# Patient Record
Sex: Female | Born: 1965
Health system: Southern US, Community
[De-identification: ages and names within clinical notes are randomized; demographics above are authoritative.]

## PROBLEM LIST (undated history)

## (undated) DIAGNOSIS — Z789 Other specified health status: Secondary | ICD-10-CM

## (undated) HISTORY — PX: TUBAL LIGATION: SHX77

---

## 1997-10-04 ENCOUNTER — Encounter: Admission: RE | Admit: 1997-10-04 | Discharge: 1998-01-02 | Payer: Self-pay | Admitting: Internal Medicine

## 2003-07-03 ENCOUNTER — Ambulatory Visit (HOSPITAL_COMMUNITY): Admission: RE | Admit: 2003-07-03 | Discharge: 2003-07-03 | Payer: Self-pay | Admitting: Obstetrics

## 2004-09-04 ENCOUNTER — Ambulatory Visit (HOSPITAL_COMMUNITY): Admission: RE | Admit: 2004-09-04 | Discharge: 2004-09-04 | Payer: Self-pay | Admitting: Obstetrics

## 2005-10-14 ENCOUNTER — Ambulatory Visit (HOSPITAL_COMMUNITY): Admission: RE | Admit: 2005-10-14 | Discharge: 2005-10-14 | Payer: Self-pay | Admitting: Obstetrics

## 2006-10-18 ENCOUNTER — Ambulatory Visit (HOSPITAL_COMMUNITY): Admission: RE | Admit: 2006-10-18 | Discharge: 2006-10-18 | Payer: Self-pay | Admitting: Obstetrics

## 2007-01-06 ENCOUNTER — Ambulatory Visit (HOSPITAL_COMMUNITY): Admission: RE | Admit: 2007-01-06 | Discharge: 2007-01-06 | Payer: Self-pay | Admitting: Obstetrics

## 2007-01-31 ENCOUNTER — Ambulatory Visit (HOSPITAL_COMMUNITY): Admission: RE | Admit: 2007-01-31 | Discharge: 2007-01-31 | Payer: Self-pay | Admitting: Obstetrics

## 2007-07-29 ENCOUNTER — Emergency Department (HOSPITAL_COMMUNITY): Admission: EM | Admit: 2007-07-29 | Discharge: 2007-07-30 | Payer: Self-pay | Admitting: Emergency Medicine

## 2007-10-20 ENCOUNTER — Ambulatory Visit (HOSPITAL_COMMUNITY): Admission: RE | Admit: 2007-10-20 | Discharge: 2007-10-20 | Payer: Self-pay | Admitting: Obstetrics

## 2008-09-21 ENCOUNTER — Encounter: Payer: Self-pay | Admitting: Obstetrics

## 2008-09-21 ENCOUNTER — Ambulatory Visit (HOSPITAL_COMMUNITY): Admission: RE | Admit: 2008-09-21 | Discharge: 2008-09-21 | Payer: Self-pay | Admitting: Obstetrics

## 2008-10-23 ENCOUNTER — Ambulatory Visit (HOSPITAL_COMMUNITY): Admission: RE | Admit: 2008-10-23 | Discharge: 2008-10-23 | Payer: Self-pay | Admitting: Obstetrics

## 2009-10-17 ENCOUNTER — Encounter: Admission: RE | Admit: 2009-10-17 | Discharge: 2009-10-17 | Payer: Self-pay | Admitting: Internal Medicine

## 2009-10-30 ENCOUNTER — Ambulatory Visit (HOSPITAL_COMMUNITY): Admission: RE | Admit: 2009-10-30 | Discharge: 2009-10-30 | Payer: Self-pay | Admitting: Obstetrics

## 2010-10-09 ENCOUNTER — Other Ambulatory Visit: Payer: Self-pay | Admitting: Obstetrics

## 2010-10-09 DIAGNOSIS — Z1231 Encounter for screening mammogram for malignant neoplasm of breast: Secondary | ICD-10-CM

## 2010-10-09 LAB — CBC
HCT: 37.2 % (ref 36.0–46.0)
Hemoglobin: 12.4 g/dL (ref 12.0–15.0)
MCHC: 33.2 g/dL (ref 30.0–36.0)
MCV: 85.9 fL (ref 78.0–100.0)
Platelets: 192 10*3/uL (ref 150–400)
RBC: 4.33 MIL/uL (ref 3.87–5.11)
RDW: 14.3 % (ref 11.5–15.5)
WBC: 4.1 10*3/uL (ref 4.0–10.5)

## 2010-10-09 LAB — PREGNANCY, URINE: Preg Test, Ur: NEGATIVE

## 2010-11-03 ENCOUNTER — Ambulatory Visit (HOSPITAL_COMMUNITY)
Admission: RE | Admit: 2010-11-03 | Discharge: 2010-11-03 | Disposition: A | Discharge status: Home / Self Care | Payer: 59 | Source: Ambulatory Visit | Attending: Obstetrics | Admitting: Obstetrics

## 2010-11-03 DIAGNOSIS — Z1231 Encounter for screening mammogram for malignant neoplasm of breast: Secondary | ICD-10-CM | POA: Insufficient documentation

## 2010-11-11 NOTE — Op Note (Signed)
Nicole [name], CIRILO NO.:  [id number]   MEDICAL RECORD NO.:  [id number]          PATIENT TYPE:  AMB   LOCATION:  SDC                           FACILITY:  [facility]   PHYSICIAN:  Charles A. Clearance Coots, M.D.DATE OF BIRTH:  [birth year]   DATE OF PROCEDURE:  09/21/2008  DATE OF DISCHARGE:                               OPERATIVE REPORT   PREOPERATIVE DIAGNOSIS:  Menorrhagia.   POSTOPERATIVE DIAGNOSIS:  Menorrhagia.   PROCEDURES:  Hysteroscopy, dilation and curettage, and bipolar  endometrial ablation.   SURGEON:  Charles A. Clearance Coots, MD   ANESTHESIA:  General.   ESTIMATED BLOOD LOSS:  25 mL.   COMPLICATIONS:  None.   FINDINGS:  Endometrial polyps.   SPECIMEN:  Endometrial curettings.   DISPOSITION:  Specimen to Pathology.   OPERATION:  The patient was brought to the operating room.  After  satisfactory endotracheal general anesthesia, the vagina was prepped and  draped in the usual sterile fashion.  Sterile speculum was inserted in  the vaginal vault and the cervix was isolated.  The cervical canal was  sounded to 4 cm from the external os to the internal os.  The uterus was  then sounded to 9 cm.  The hysteroscope was then introduced into the  uterine cavity and hysteroscopic survey was done, 1-2 cm endometrial  polyps were observed along the anterior and posterior endometrium.  The  cervix was then dilated to 25 mm.  A  medium sharp curette was then  introduced into the uterine cavity and the endometrial cavity was  thoroughly curetted and the specimen was submitted to Pathology for  evaluation.  The NovaSure bipolar endometrial ablation instrument was  then introduced into the uterine cavity and the bipolar endometrial  ablation was performed in routine fashion at a power of 129 watts for 57  seconds.  The NovaSure instrument was then removed and the hysteroscope  was reintroduced into the uterine cavity and hysteroscopic survey  revealed a very adequate ablation  to the most posterior aspect of the  uterus and in each cornu.  There were no  polyps observed and the endometrial cavity appeared free of polyps.  The  hysteroscope was then removed.  There was no active bleeding at the  conclusion of the procedure.  All instruments were retired.  The patient  tolerated the procedure well, transported to the recovery room in  satisfactory condition.       Charles A. Clearance Coots, M.D.  Electronically Signed     CAH/MEDQ  D:  09/21/2008  T:  09/22/2008  Job:  [id number]

## 2011-03-19 LAB — D-DIMER, QUANTITATIVE: D-Dimer, Quant: 0.5 — ABNORMAL HIGH

## 2011-03-19 LAB — POCT CARDIAC MARKERS
CKMB, poc: <1 — ABNORMAL LOW
CKMB, poc: <1 — ABNORMAL LOW
Myoglobin, poc: 40.8
Myoglobin, poc: 46.8
Operator id: 294511
Operator id: 294511
Troponin i, poc: <0.05
Troponin i, poc: <0.05

## 2011-03-19 LAB — DIFFERENTIAL
Basophils Absolute: 0
Basophils Relative: 1
Eosinophils Absolute: 0.1
Eosinophils Relative: 1
Lymphocytes Relative: 29
Lymphs Abs: 1.7
Monocytes Absolute: 0.5
Monocytes Relative: 8
Neutro Abs: 3.7
Neutrophils Relative %: 62

## 2011-03-19 LAB — CBC
HCT: 35.4 — ABNORMAL LOW
Hemoglobin: 11.9 — ABNORMAL LOW
MCHC: 33.5
MCV: 85
Platelets: 201
RBC: 4.17
RDW: 14.6
WBC: 6

## 2011-03-19 LAB — I-STAT 8, (EC8 V) (CONVERTED LAB)
Acid-base deficit: 3 — ABNORMAL HIGH
BUN: 10
Bicarbonate: 22.9
Chloride: 107
Glucose, Bld: 118 — ABNORMAL HIGH
HCT: 39
Hemoglobin: 13.3
Operator id: 161631
Potassium: 3.5
Sodium: 141
TCO2: 24
pCO2, Ven: 42.3 — ABNORMAL LOW
pH, Ven: 7.343 — ABNORMAL HIGH

## 2011-03-19 LAB — PROTIME-INR
INR: 1
Prothrombin Time: 13.2

## 2011-03-19 LAB — POCT I-STAT CREATININE
Creatinine, Ser: 0.9
Operator id: 161631

## 2011-10-12 ENCOUNTER — Other Ambulatory Visit: Payer: Self-pay | Admitting: Obstetrics

## 2011-10-12 DIAGNOSIS — Z1231 Encounter for screening mammogram for malignant neoplasm of breast: Secondary | ICD-10-CM

## 2011-11-05 ENCOUNTER — Ambulatory Visit (HOSPITAL_COMMUNITY)
Admission: RE | Admit: 2011-11-05 | Discharge: 2011-11-05 | Disposition: A | Discharge status: Home / Self Care | Payer: 59 | Source: Ambulatory Visit | Attending: Obstetrics | Admitting: Obstetrics

## 2011-11-05 DIAGNOSIS — Z1231 Encounter for screening mammogram for malignant neoplasm of breast: Secondary | ICD-10-CM | POA: Insufficient documentation

## 2012-10-06 ENCOUNTER — Other Ambulatory Visit: Payer: Self-pay | Admitting: Obstetrics

## 2012-10-06 DIAGNOSIS — Z1231 Encounter for screening mammogram for malignant neoplasm of breast: Secondary | ICD-10-CM

## 2012-11-07 ENCOUNTER — Ambulatory Visit (HOSPITAL_COMMUNITY)
Admission: RE | Admit: 2012-11-07 | Discharge: 2012-11-07 | Disposition: A | Discharge status: Home / Self Care | Payer: 59 | Source: Ambulatory Visit | Attending: Obstetrics | Admitting: Obstetrics

## 2012-11-07 DIAGNOSIS — Z1231 Encounter for screening mammogram for malignant neoplasm of breast: Secondary | ICD-10-CM | POA: Insufficient documentation

## 2013-10-03 ENCOUNTER — Other Ambulatory Visit: Payer: Self-pay | Admitting: Obstetrics

## 2013-10-03 DIAGNOSIS — Z1231 Encounter for screening mammogram for malignant neoplasm of breast: Secondary | ICD-10-CM

## 2013-11-09 ENCOUNTER — Ambulatory Visit (HOSPITAL_COMMUNITY)
Admission: RE | Admit: 2013-11-09 | Discharge: 2013-11-09 | Disposition: A | Discharge status: Home / Self Care | Payer: 59 | Source: Ambulatory Visit | Attending: Obstetrics | Admitting: Obstetrics

## 2013-11-09 DIAGNOSIS — Z1231 Encounter for screening mammogram for malignant neoplasm of breast: Secondary | ICD-10-CM | POA: Insufficient documentation

## 2014-03-06 ENCOUNTER — Ambulatory Visit: Payer: 59 | Admitting: Obstetrics

## 2014-04-04 ENCOUNTER — Ambulatory Visit (INDEPENDENT_AMBULATORY_CARE_PROVIDER_SITE_OTHER): Payer: 59 | Admitting: Obstetrics

## 2014-04-04 ENCOUNTER — Encounter: Payer: Self-pay | Admitting: Obstetrics

## 2014-04-04 VITALS — BP 130/88 | HR 81 | Temp 97.4°F | Ht 68.5 in | Wt 208.0 lb

## 2014-04-04 DIAGNOSIS — Z0000 Encounter for general adult medical examination without abnormal findings: Secondary | ICD-10-CM

## 2014-04-04 DIAGNOSIS — Z01419 Encounter for gynecological examination (general) (routine) without abnormal findings: Secondary | ICD-10-CM

## 2014-04-04 NOTE — Progress Notes (Signed)
Subjective:     Karna DupesLaquita M Souffrant is a 48 y.o. female here for a routine exam.  Current complaints: none.    Personal health questionnaire:  Is patient Ashkenazi Jewish, have a family history of breast and/or ovarian cancer: no Is there a family history of uterine cancer diagnosed at age < 5250, gastrointestinal cancer, urinary tract cancer, family member who is a Personnel officerLynch syndrome-associated carrier: no Is the patient overweight and hypertensive, family history of diabetes, personal history of gestational diabetes or PCOS: no Is patient over 3655, have PCOS,  family history of premature CHD under age 50, diabetes, smoke, have hypertension or peripheral artery disease:  no At any time, has a partner hit, kicked or otherwise hurt or frightened you?: no Over the past 2 weeks, have you felt down, depressed or hopeless?: no Over the past 2 weeks, have you felt little interest or pleasure in doing things?:no   Gynecologic History No LMP recorded. Contraception: tubal ligation Last Pap: 2014. Results were: normal Last mammogram: 2015. Results were: normal  Obstetric History OB History  No data available    History reviewed. No pertinent past medical history.  History reviewed. No pertinent past surgical history.  No current outpatient prescriptions on file. Not on File  History  Substance Use Topics  . Smoking status: Not on file  . Smokeless tobacco: Not on file  . Alcohol Use: Not on file    History reviewed. No pertinent family history.    Review of Systems  Constitutional: negative for fatigue and weight loss Respiratory: negative for cough and wheezing Cardiovascular: negative for chest pain, fatigue and palpitations Gastrointestinal: negative for abdominal pain and change in bowel habits Musculoskeletal:negative for myalgias Neurological: negative for gait problems and tremors Behavioral/Psych: negative for abusive relationship, depression Endocrine: negative for temperature  intolerance   Genitourinary:negative for abnormal menstrual periods, genital lesions, hot flashes, sexual problems and vaginal discharge Integument/breast: negative for breast lump, breast tenderness, nipple discharge and skin lesion(s)    Objective:       There were no vitals taken for this visit. General:   alert  Skin:   no rash or abnormalities  Lungs:   clear to auscultation bilaterally  Heart:   regular rate and rhythm, S1, S2 normal, no murmur, click, rub or gallop  Breasts:   normal without suspicious masses, skin or nipple changes or axillary nodes  Abdomen:  normal findings: no organomegaly, soft, non-tender and no hernia  Pelvis:  External genitalia: normal general appearance Urinary system: urethral meatus normal and bladder without fullness, nontender Vaginal: normal without tenderness, induration or masses Cervix: normal appearance Adnexa: normal bimanual exam Uterus: anteverted and non-tender, normal size   Lab Review Urine pregnancy test Labs reviewed yes Radiologic studies reviewed yes    Assessment:    Healthy female exam.    Plan:    Education reviewed: calcium supplements, low fat, low cholesterol diet, self breast exams and weight bearing exercise. Follow up in: 1 year.   No orders of the defined types were placed in this encounter.   No orders of the defined types were placed in this encounter.

## 2014-04-04 NOTE — Addendum Note (Signed)
Addended by: Coral CeoHARPER, Hatley Henegar A on: 04/04/2014 05:13 PM   Modules accepted: Orders

## 2014-04-05 ENCOUNTER — Other Ambulatory Visit: Payer: Self-pay | Admitting: Obstetrics

## 2014-04-05 DIAGNOSIS — B9689 Other specified bacterial agents as the cause of diseases classified elsewhere: Secondary | ICD-10-CM

## 2014-04-05 DIAGNOSIS — N760 Acute vaginitis: Secondary | ICD-10-CM

## 2014-04-05 LAB — WET PREP BY MOLECULAR PROBE
Candida species: NEGATIVE
GARDNERELLA VAGINALIS: POSITIVE — AB
Trichomonas vaginosis: NEGATIVE

## 2014-04-05 MED ORDER — METRONIDAZOLE 500 MG PO TABS: 500.0000 mg | ORAL_TABLET | Freq: Two times a day (BID) | ORAL | Status: DC

## 2014-04-06 LAB — PAP IG AND HPV HIGH-RISK: HPV DNA HIGH RISK: NOT DETECTED

## 2014-04-30 ENCOUNTER — Encounter: Payer: Self-pay | Admitting: Obstetrics

## 2014-07-25 ENCOUNTER — Other Ambulatory Visit: Payer: Self-pay | Admitting: Gastroenterology

## 2014-09-26 ENCOUNTER — Encounter (HOSPITAL_COMMUNITY): Payer: Self-pay | Admitting: *Deleted

## 2014-10-08 ENCOUNTER — Encounter (HOSPITAL_COMMUNITY): Payer: Self-pay | Admitting: *Deleted

## 2014-10-08 ENCOUNTER — Ambulatory Visit (HOSPITAL_COMMUNITY): Payer: 59 | Admitting: Anesthesiology

## 2014-10-08 ENCOUNTER — Encounter (HOSPITAL_COMMUNITY)
Admission: RE | Disposition: A | Discharge status: Home / Self Care | Payer: Self-pay | Source: Ambulatory Visit | Attending: Gastroenterology

## 2014-10-08 ENCOUNTER — Ambulatory Visit (HOSPITAL_COMMUNITY)
Admission: RE | Admit: 2014-10-08 | Discharge: 2014-10-08 | Disposition: A | Discharge status: Home / Self Care | Payer: 59 | Source: Ambulatory Visit | Attending: Gastroenterology | Admitting: Gastroenterology

## 2014-10-08 DIAGNOSIS — Z6829 Body mass index (BMI) 29.0-29.9, adult: Secondary | ICD-10-CM | POA: Insufficient documentation

## 2014-10-08 DIAGNOSIS — Z1211 Encounter for screening for malignant neoplasm of colon: Secondary | ICD-10-CM | POA: Insufficient documentation

## 2014-10-08 DIAGNOSIS — Z9851 Tubal ligation status: Secondary | ICD-10-CM | POA: Insufficient documentation

## 2014-10-08 DIAGNOSIS — E669 Obesity, unspecified: Secondary | ICD-10-CM | POA: Insufficient documentation

## 2014-10-08 HISTORY — DX: Other specified health status: Z78.9

## 2014-10-08 HISTORY — PX: COLONOSCOPY WITH PROPOFOL: SHX5780

## 2014-10-08 SURGERY — COLONOSCOPY WITH PROPOFOL
Anesthesia: Monitor Anesthesia Care

## 2014-10-08 MED ORDER — PROPOFOL 10 MG/ML IV BOLUS
INTRAVENOUS | Status: DC | PRN
  Administered 2014-10-08: 100 mg via INTRAVENOUS
  Administered 2014-10-08 (×2): 10 mg via INTRAVENOUS
  Administered 2014-10-08: 100 mg via INTRAVENOUS

## 2014-10-08 MED ORDER — LIDOCAINE HCL (PF) 2 % IJ SOLN
INTRAMUSCULAR | Status: DC | PRN
  Administered 2014-10-08: 20 mg via INTRADERMAL

## 2014-10-08 MED ORDER — PROPOFOL 10 MG/ML IV BOLUS
INTRAVENOUS | Status: AC
  Filled 2014-10-08: qty 20

## 2014-10-08 MED ORDER — LIDOCAINE HCL (CARDIAC) 20 MG/ML IV SOLN
INTRAVENOUS | Status: AC
  Filled 2014-10-08: qty 5

## 2014-10-08 MED ORDER — SODIUM CHLORIDE 0.9 % IV SOLN: INTRAVENOUS | Status: DC

## 2014-10-08 MED ORDER — LACTATED RINGERS IV SOLN
INTRAVENOUS | Status: DC
  Administered 2014-10-08: 1000 mL via INTRAVENOUS

## 2014-10-08 SURGICAL SUPPLY — 22 items

## 2014-10-08 NOTE — Anesthesia Postprocedure Evaluation (Signed)
Anesthesia Post Note  Patient: Nicole [name] Man  Procedure(s) Performed: Procedure(s) (LRB): COLONOSCOPY WITH PROPOFOL (N/A)  Anesthesia type: MAC  Patient location: PACU  Post pain: Pain level controlled  Post assessment: Post-op Vital signs reviewed  Last Vitals: BP 164/72 mmHg  Pulse 52  Temp(Src) 36.6 C (Oral)  Resp 20  Ht 5' 8.5" (1.74 [name])  Wt 198 lb (89.812 kg)  BMI 29.66 kg/m2  SpO2 100%  LMP 10/08/2014 (Exact Date)  Post vital signs: Reviewed  Level of consciousness: awake  Complications: No apparent anesthesia complications

## 2014-10-08 NOTE — Transfer of Care (Signed)
Immediate Anesthesia Transfer of Care Note  Patient: Nicole [name]  Procedure(s) Performed: Procedure(s) (LRB): COLONOSCOPY WITH PROPOFOL (N/A)  Patient Location: PACU  Anesthesia Type: MAC  Level of Consciousness: sedated, patient cooperative and responds to stimulation  Airway & Oxygen Therapy: Patient Spontanous Breathing and Patient connected to face mask oxgen  Post-op Assessment: Report given to PACU RN and Post -op Vital signs reviewed and stable  Post vital signs: Reviewed and stable  Complications: No apparent anesthesia complications

## 2014-10-08 NOTE — Anesthesia Preprocedure Evaluation (Signed)
Anesthesia Evaluation  Patient identified by MRN, date of birth, ID band Patient awake    Reviewed: Allergy & Precautions, NPO status , Patient's Chart, lab work & pertinent test results  Airway Mallampati: II  TM Distance: >3 FB Neck ROM: Full    Dental no notable dental hx.    Pulmonary neg pulmonary ROS,  breath sounds clear to auscultation  Pulmonary exam normal       Cardiovascular negative cardio ROS  Rhythm:Regular Rate:Normal     Neuro/Psych negative neurological ROS  negative psych ROS   GI/Hepatic negative GI ROS, Neg liver ROS,   Endo/Other  negative endocrine ROS  Renal/GU negative Renal ROS     Musculoskeletal negative musculoskeletal ROS (+)   Abdominal (+) + obese,   Peds  Hematology negative hematology ROS (+)   Anesthesia Other Findings   Reproductive/Obstetrics negative OB ROS                             Anesthesia Physical Anesthesia Plan  ASA: II  Anesthesia Plan: MAC   Post-op Pain Management:    Induction: Intravenous  Airway Management Planned:   Additional Equipment:   Intra-op Plan:   Post-operative Plan:   Informed Consent: I have reviewed the patients History and Physical, chart, labs and discussed the procedure including the risks, benefits and alternatives for the proposed anesthesia with the patient or authorized representative who has indicated his/her understanding and acceptance.   Dental advisory given  Plan Discussed with: CRNA  Anesthesia Plan Comments:         Anesthesia Quick Evaluation

## 2014-10-08 NOTE — Discharge Instructions (Signed)

## 2014-10-08 NOTE — Op Note (Signed)
Procedure: Baseline screening colonoscopy  Endoscopist: Danise EdgeMartin Johnson  Premedication: Propofol administered by anesthesia  Procedure: The patient was placed in the left lateral decubitus position. Anal inspection and digital rectal exam were normal. The Pentax pediatric colonoscope was introduced into the rectum and advanced to the cecum. A normal-appearing ileocecal valve and appendiceal orifice were identified. Colonic preparation for the exam today was good. Withdrawal time was 10 minutes  Rectum. Normal. Retroflexed view of the distal rectum normal  Sigmoid colon and descending colon. Normal  Splenic flexure. Normal  Transverse colon. Normal  Hepatic flexure. Normal  Ascending colon. Normal  Cecum and ileocecal valve. Normal  Assessment: Normal baseline screening colonoscopy  Recommendation: Schedule repeat screening colonoscopy in 10 years

## 2014-10-08 NOTE — H&P (Signed)
  Procedure: Baseline screening colonoscopy.  History: The patient is a [age] year old female born in 1966-03-27. She is scheduled to undergo her first screening colonoscopy.  Past medical history: Bilateral tubal ligation  Family history: No family history of colon cancer  Exam: The patient is alert and lying comfortably on the endoscopy stretcher. Abdomen is soft and nontender to palpation. Lungs are clear to auscultation. Cardiac exam reveals a regular rhythm.  Plan: Proceed with screening colonoscopy

## 2014-10-09 ENCOUNTER — Encounter (HOSPITAL_COMMUNITY): Payer: Self-pay | Admitting: Gastroenterology

## 2014-11-01 ENCOUNTER — Other Ambulatory Visit: Payer: Self-pay | Admitting: Obstetrics

## 2014-11-01 DIAGNOSIS — Z1231 Encounter for screening mammogram for malignant neoplasm of breast: Secondary | ICD-10-CM

## 2014-11-12 ENCOUNTER — Ambulatory Visit (HOSPITAL_COMMUNITY)
Admission: RE | Admit: 2014-11-12 | Discharge: 2014-11-12 | Disposition: A | Discharge status: Home / Self Care | Payer: 59 | Source: Ambulatory Visit | Attending: Obstetrics | Admitting: Obstetrics

## 2014-11-12 DIAGNOSIS — Z1231 Encounter for screening mammogram for malignant neoplasm of breast: Secondary | ICD-10-CM | POA: Diagnosis not present

## 2015-04-08 ENCOUNTER — Ambulatory Visit: Payer: 59 | Admitting: Obstetrics

## 2015-05-13 ENCOUNTER — Ambulatory Visit: Payer: Self-pay | Admitting: Obstetrics

## 2015-05-21 ENCOUNTER — Ambulatory Visit (INDEPENDENT_AMBULATORY_CARE_PROVIDER_SITE_OTHER): Payer: 59 | Admitting: Obstetrics

## 2015-05-21 ENCOUNTER — Encounter: Payer: Self-pay | Admitting: Obstetrics

## 2015-05-21 VITALS — BP 131/84 | HR 81 | Temp 97.9°F | Wt 215.0 lb

## 2015-05-21 DIAGNOSIS — Z01419 Encounter for gynecological examination (general) (routine) without abnormal findings: Secondary | ICD-10-CM

## 2015-05-21 NOTE — Progress Notes (Signed)
Subjective:        Nicole [name] is a 49 y.o. female here for a routine exam.  Current complaints: none.    Personal health questionnaire:  Is patient Ashkenazi Jewish, have a family history of breast and/or ovarian cancer: no Is there a family history of uterine cancer diagnosed at age < 12, gastrointestinal cancer, urinary tract cancer, family member who is a Personnel officer syndrome-associated carrier: no Is the patient overweight and hypertensive, family history of diabetes, personal history of gestational diabetes, preeclampsia or PCOS: no Is patient over 52, have PCOS,  family history of premature CHD under age 65, diabetes, smoke, have hypertension or peripheral artery disease:  no At any time, has a partner hit, kicked or otherwise hurt or frightened you?: no Over the past 2 weeks, have you felt down, depressed or hopeless?: no Over the past 2 weeks, have you felt little interest or pleasure in doing things?:no   Gynecologic History Patient's last menstrual period was 05/12/2015. Contraception: tubal ligation Last Pap: 2015. Results were: normal Last mammogram: 2016. Results were: normal  Obstetric History OB History  Gravida Para Term Preterm AB SAB TAB Ectopic Multiple Living  # Outcome Date GA Lbr Len/2nd Weight Sex Delivery Anes PTL Lv  2 Term      Vag-Spont   Y  1 Term      Vag-Spont   Y      Past Medical History  Diagnosis Date  . Medical history non-contributory     Past Surgical History  Procedure Laterality Date  . Tubal ligation    . Colonoscopy with propofol N/A 10/08/2014    Procedure: COLONOSCOPY WITH PROPOFOL;  Surgeon: Charolett Bumpers, MD;  Location: [facility];  Service: Endoscopy;  Laterality: N/A;     Current outpatient prescriptions:  Marland Kitchen  Multiple Vitamin (MULTIVITAMIN WITH MINERALS) TABS tablet, Take 1 tablet by mouth every morning., Disp: , Rfl:  No Known Allergies  Social History  Substance Use Topics  . Smoking status:  Never Smoker   . Smokeless tobacco: Never Used  . Alcohol Use: 1.2 - 1.8 oz/week    2-3 Glasses of wine per week    Family History  Problem Relation Age of Onset  . Alzheimer's disease Mother   . Hypertension Father   . Hyperlipidemia Father   . Diabetes Father       Review of Systems  Constitutional: negative for fatigue and weight loss Respiratory: negative for cough and wheezing Cardiovascular: negative for chest pain, fatigue and palpitations Gastrointestinal: negative for abdominal pain and change in bowel habits Musculoskeletal:negative for myalgias Neurological: negative for gait problems and tremors Behavioral/Psych: negative for abusive relationship, depression Endocrine: negative for temperature intolerance   Genitourinary:negative for abnormal menstrual periods, genital lesions, hot flashes, sexual problems and vaginal discharge Integument/breast: negative for breast lump, breast tenderness, nipple discharge and skin lesion(s)    Objective:       BP 131/84 mmHg  Pulse 81  Temp(Src) 97.9 F (36.6 C)  Wt 215 lb (97.523 kg)  LMP 05/12/2015 General:   alert  Skin:   no rash or abnormalities  Lungs:   clear to auscultation bilaterally  Heart:   regular rate and rhythm, S1, S2 normal, no murmur, click, rub or gallop  Breasts:   normal without suspicious masses, skin or nipple changes or axillary nodes  Abdomen:  normal findings: no organomegaly, soft, non-tender and no hernia  Pelvis:  External genitalia: normal general appearance Urinary system: urethral meatus normal and bladder without fullness, nontender Vaginal: normal without tenderness, induration or masses Cervix: normal appearance Adnexa: normal bimanual exam Uterus: anteverted and non-tender, normal size   Lab Review Urine pregnancy test Labs reviewed yes Radiologic studies reviewed yes    Assessment:    Healthy female exam.    Plan:    Education reviewed: calcium supplements, low fat, low  cholesterol diet, self breast exams and weight bearing exercise. Contraception: tubal ligation. Follow up in: 1 year.   No orders of the defined types were placed in this encounter.   Orders Placed This Encounter  Procedures  . SureSwab Bacterial Vaginosis/itis

## 2015-05-25 LAB — PAP, TP IMAGING W/ HPV RNA, RFLX HPV TYPE 16,18/45: HPV mRNA, High Risk: NOT DETECTED

## 2015-05-26 LAB — SURESWAB BACTERIAL VAGINOSIS/ITIS
Atopobium vaginae: 6.7 Log (cells/mL)
C. GLABRATA, DNA: NOT DETECTED
C. PARAPSILOSIS, DNA: NOT DETECTED
C. TROPICALIS, DNA: NOT DETECTED
C. albicans, DNA: NOT DETECTED
GARDNERELLA VAGINALIS: 7.9 Log (cells/mL)
LACTOBACILLUS SPECIES: NOT DETECTED Log (cells/mL)
MEGASPHAERA SPECIES: 6.5 Log (cells/mL)
T. vaginalis RNA, QL TMA: NOT DETECTED

## 2015-05-27 ENCOUNTER — Other Ambulatory Visit: Payer: Self-pay | Admitting: Obstetrics

## 2015-05-27 DIAGNOSIS — B9689 Other specified bacterial agents as the cause of diseases classified elsewhere: Secondary | ICD-10-CM

## 2015-05-27 DIAGNOSIS — N760 Acute vaginitis: Secondary | ICD-10-CM

## 2015-05-27 MED ORDER — METRONIDAZOLE 500 MG PO TABS: 500.0000 mg | ORAL_TABLET | Freq: Two times a day (BID) | ORAL | Status: DC

## 2015-10-09 ENCOUNTER — Other Ambulatory Visit: Payer: Self-pay

## 2015-10-09 DIAGNOSIS — Z1231 Encounter for screening mammogram for malignant neoplasm of breast: Secondary | ICD-10-CM

## 2015-11-14 ENCOUNTER — Ambulatory Visit
Admission: RE | Admit: 2015-11-14 | Discharge: 2015-11-14 | Disposition: A | Discharge status: Home / Self Care | Payer: 59 | Source: Ambulatory Visit

## 2015-11-14 DIAGNOSIS — Z1231 Encounter for screening mammogram for malignant neoplasm of breast: Secondary | ICD-10-CM

## 2015-12-17 ENCOUNTER — Ambulatory Visit (INDEPENDENT_AMBULATORY_CARE_PROVIDER_SITE_OTHER): Payer: 59 | Admitting: Obstetrics

## 2015-12-17 ENCOUNTER — Other Ambulatory Visit: Payer: Self-pay | Admitting: Obstetrics

## 2015-12-17 ENCOUNTER — Encounter: Payer: Self-pay | Admitting: Obstetrics

## 2015-12-17 VITALS — BP 125/81 | HR 62 | Wt 215.0 lb

## 2015-12-17 DIAGNOSIS — B3731 Acute candidiasis of vulva and vagina: Secondary | ICD-10-CM

## 2015-12-17 DIAGNOSIS — N841 Polyp of cervix uteri: Secondary | ICD-10-CM

## 2015-12-17 DIAGNOSIS — B373 Candidiasis of vulva and vagina: Secondary | ICD-10-CM | POA: Diagnosis not present

## 2015-12-17 MED ORDER — TERCONAZOLE 0.4 % VA CREA: 1.0000 | TOPICAL_CREAM | Freq: Every day | VAGINAL | Status: DC

## 2015-12-17 NOTE — Progress Notes (Signed)
Patient ID: Nicole [name], female   DOB: 1966-03-01, 50 y.o.   MRN: [id number]  Chief Complaint  Patient presents with  . Vaginitis    vaginal itching and burning.    HPI Nicole [name] is a 50 y.o. female.  Vaginal itching and burning.  Recently changed soaps and has been douching.  HPI  Past Medical History  Diagnosis Date  . Medical history non-contributory     Past Surgical History  Procedure Laterality Date  . Tubal ligation    . Colonoscopy with propofol N/A 10/08/2014    Procedure: COLONOSCOPY WITH PROPOFOL;  Surgeon: Charolett BumpersMartin K Johnson, MD;  Location: [facility];  Service: Endoscopy;  Laterality: N/A;    Family History  Problem Relation Age of Onset  . Alzheimer's disease Mother   . Hypertension Father   . Hyperlipidemia Father   . Diabetes Father     Social History Social History  Substance Use Topics  . Smoking status: Never Smoker   . Smokeless tobacco: Never Used  . Alcohol Use: 1.2 - 1.8 oz/week    2-3 Glasses of wine per week    No Known Allergies  Current Outpatient Prescriptions  Medication Sig Dispense Refill  . Multiple Vitamin (MULTIVITAMIN WITH MINERALS) TABS tablet Take 1 tablet by mouth every morning. Reported on 12/17/2015    . terconazole (TERAZOL 7) 0.4 % vaginal cream Place 1 applicator vaginally at bedtime. 45 g 0   No current facility-administered medications for this visit.    Review of Systems Review of Systems Constitutional: negative for fatigue and weight loss Respiratory: negative for cough and wheezing Cardiovascular: negative for chest pain, fatigue and palpitations Gastrointestinal: negative for abdominal pain and change in bowel habits Genitourinary:positive for vaginal itching and burning Integument/breast: negative for nipple discharge Musculoskeletal:negative for myalgias Neurological: negative for gait problems and tremors Behavioral/Psych: negative for abusive relationship, depression Endocrine: negative for  temperature intolerance     Blood pressure 125/81, pulse 62, weight 215 lb (97.523 kg), last menstrual period 11/25/2015.  Physical Exam Physical Exam           General:  Alert and no distress Abdomen:  normal findings: no organomegaly, soft, non-tender and no hernia  Pelvis:  External genitalia: normal general appearance Urinary system: urethral meatus normal and bladder without fullness, nontender Vaginal: normal without tenderness, induration or masses Cervix: small cervical polyp, grasped with forceps and removed.  Base of polyp cauterized with silver nitrate Adnexa: normal bimanual exam Uterus: anteverted and non-tender, normal size      Data Reviewed Wet prep  Assessment     Probable candida vaginitis.  Cervical polyp    Plan    Terazol 7 Rx Polyp submitted to pathology  No orders of the defined types were placed in this encounter.   Meds ordered this encounter  Medications  . terconazole (TERAZOL 7) 0.4 % vaginal cream    Sig: Place 1 applicator vaginally at bedtime.    Dispense:  45 g    Refill:  0

## 2015-12-18 ENCOUNTER — Other Ambulatory Visit: Payer: Self-pay | Admitting: Obstetrics

## 2015-12-27 ENCOUNTER — Other Ambulatory Visit: Payer: Self-pay | Admitting: Obstetrics

## 2015-12-27 LAB — NUSWAB VG, CANDIDA 6SP
CANDIDA ALBICANS, NAA: NEGATIVE
CANDIDA GLABRATA, NAA: NEGATIVE
Candida krusei, NAA: NEGATIVE
Candida lusitaniae, NAA: NEGATIVE
Candida parapsilosis, NAA: POSITIVE — AB
Candida tropicalis, NAA: NEGATIVE
TRICH VAG BY NAA: NEGATIVE

## 2016-05-26 ENCOUNTER — Encounter: Payer: Self-pay | Admitting: Obstetrics

## 2016-05-26 ENCOUNTER — Ambulatory Visit (INDEPENDENT_AMBULATORY_CARE_PROVIDER_SITE_OTHER): Payer: 59 | Admitting: Obstetrics

## 2016-05-26 DIAGNOSIS — Z01419 Encounter for gynecological examination (general) (routine) without abnormal findings: Secondary | ICD-10-CM | POA: Diagnosis not present

## 2016-05-26 DIAGNOSIS — Z1151 Encounter for screening for human papillomavirus (HPV): Secondary | ICD-10-CM | POA: Diagnosis not present

## 2016-05-26 DIAGNOSIS — Z124 Encounter for screening for malignant neoplasm of cervix: Secondary | ICD-10-CM | POA: Diagnosis not present

## 2016-05-26 NOTE — Progress Notes (Signed)
Subjective:        Nicole [name] is a 50 y.o. female here for a routine exam.  Current complaints: None.    Personal health questionnaire:  Is patient Ashkenazi Jewish, have a family history of breast and/or ovarian cancer: no Is there a family history of uterine cancer diagnosed at age < 2950, gastrointestinal cancer, urinary tract cancer, family member who is a Personnel officerLynch syndrome-associated carrier: no Is the patient overweight and hypertensive, family history of diabetes, personal history of gestational diabetes, preeclampsia or PCOS: no Is patient over 4055, have PCOS,  family history of premature CHD under age 52, diabetes, smoke, have hypertension or peripheral artery disease:  no At any time, has a partner hit, kicked or otherwise hurt or frightened you?: no Over the past 2 weeks, have you felt down, depressed or hopeless?: no Over the past 2 weeks, have you felt little interest or pleasure in doing things?:no   Gynecologic History No LMP recorded. Contraception: tubal ligation Last Pap: 2016. Results were: normal Last mammogram: 2017. Results were: normal  Obstetric History OB History  Gravida Para Term Preterm AB Living  2 2 2     2   SAB TAB Ectopic Multiple Live Births          2    # Outcome Date GA Lbr Len/2nd Weight Sex Delivery Anes PTL Lv  2 Term      Vag-Spont   LIV  1 Term      Vag-Spont   LIV      Past Medical History:  Diagnosis Date  . Medical history non-contributory     Past Surgical History:  Procedure Laterality Date  . COLONOSCOPY WITH PROPOFOL N/A 10/08/2014   Procedure: COLONOSCOPY WITH PROPOFOL;  Surgeon: Charolett BumpersMartin K Johnson, MD;  Location: [facility];  Service: Endoscopy;  Laterality: N/A;  . TUBAL LIGATION       Current Outpatient Prescriptions:  Marland Kitchen.  Multiple Vitamin (MULTIVITAMIN WITH MINERALS) TABS tablet, Take 1 tablet by mouth every morning. Reported on 12/17/2015, Disp: , Rfl:  .  terconazole (TERAZOL 7) 0.4 % vaginal cream, Place 1  applicator vaginally at bedtime., Disp: 45 g, Rfl: 0 No Known Allergies  Social History  Substance Use Topics  . Smoking status: Never Smoker  . Smokeless tobacco: Never Used  . Alcohol use 1.2 - 1.8 oz/week    2 - 3 Glasses of wine per week    Family History  Problem Relation Age of Onset  . Alzheimer's disease Mother   . Hypertension Father   . Hyperlipidemia Father   . Diabetes Father       Review of Systems  Constitutional: negative for fatigue and weight loss Respiratory: negative for cough and wheezing Cardiovascular: negative for chest pain, fatigue and palpitations Gastrointestinal: negative for abdominal pain and change in bowel habits Musculoskeletal:negative for myalgias Neurological: negative for gait problems and tremors Behavioral/Psych: negative for abusive relationship, depression Endocrine: negative for temperature intolerance    Genitourinary:negative for abnormal menstrual periods, genital lesions, hot flashes, sexual problems and vaginal discharge Integument/breast: negative for breast lump, breast tenderness, nipple discharge and skin lesion(s)    Objective:       BP 132/73   Pulse 61   Temp 98.1 F (36.7 C) (Oral)   Wt 218 lb 4.8 oz (99 kg)   BMI 32.71 kg/m  General:   alert  Skin:   no rash or abnormalities  Lungs:   clear to auscultation bilaterally  Heart:  regular rate and rhythm, S1, S2 normal, no murmur, click, rub or gallop  Breasts:   normal without suspicious masses, skin or nipple changes or axillary nodes  Abdomen:  normal findings: no organomegaly, soft, non-tender and no hernia  Pelvis:  External genitalia: normal general appearance Urinary system: urethral meatus normal and bladder without fullness, nontender Vaginal: normal without tenderness, induration or masses Cervix: normal appearance Adnexa: normal bimanual exam Uterus: anteverted and non-tender, normal size   Lab Review Urine pregnancy test Labs reviewed  yes Radiologic studies reviewed yes  50% of 20 min visit spent on counseling and coordination of care.    Assessment:    Healthy female exam.    Plan:    Education reviewed: calcium supplements, depression evaluation, low fat, low cholesterol diet, safe sex/STD prevention, self breast exams and weight bearing exercise. Follow up in: 1 year.   No orders of the defined types were placed in this encounter.  No orders of the defined types were placed in this encounter.    Patient ID: Nicole [name], female   DOB: 1965/11/26, 50 y.o.   MRN: [id number] Patient ID: Nicole [name], female   DOB: 1965/11/26, 50 y.o.   MRN: [id number]

## 2016-05-26 NOTE — Addendum Note (Signed)
Addended by: Francene FindersJAMES, Danzel Marszalek C on: 05/26/2016 03:58 PM   Modules accepted: Orders

## 2016-06-01 LAB — CYTOLOGY - PAP
Diagnosis: NEGATIVE
HPV (WINDOPATH): NOT DETECTED

## 2016-06-02 ENCOUNTER — Other Ambulatory Visit: Payer: Self-pay | Admitting: Obstetrics

## 2016-06-02 DIAGNOSIS — B373 Candidiasis of vulva and vagina: Secondary | ICD-10-CM

## 2016-06-02 DIAGNOSIS — B3731 Acute candidiasis of vulva and vagina: Secondary | ICD-10-CM

## 2016-06-02 LAB — NUSWAB VG+, CANDIDA 6SP
CANDIDA ALBICANS, NAA: NEGATIVE
CANDIDA GLABRATA, NAA: NEGATIVE
CANDIDA LUSITANIAE, NAA: NEGATIVE
CANDIDA PARAPSILOSIS, NAA: NEGATIVE
Candida krusei, NAA: NEGATIVE
Candida tropicalis, NAA: POSITIVE — AB
Chlamydia trachomatis, NAA: NEGATIVE
NEISSERIA GONORRHOEAE, NAA: NEGATIVE
Trich vag by NAA: NEGATIVE

## 2016-06-02 MED ORDER — TERCONAZOLE 0.4 % VA CREA: 1.0000 | TOPICAL_CREAM | Freq: Every day | VAGINAL | 0 refills | Status: AC

## 2016-06-03 ENCOUNTER — Telehealth: Payer: Self-pay

## 2016-06-03 NOTE — Telephone Encounter (Signed)
Spoke with patient and advised of results and rx per provider. 

## 2016-10-12 ENCOUNTER — Other Ambulatory Visit: Payer: Self-pay | Admitting: Obstetrics

## 2016-10-12 DIAGNOSIS — Z1231 Encounter for screening mammogram for malignant neoplasm of breast: Secondary | ICD-10-CM

## 2016-11-16 ENCOUNTER — Ambulatory Visit: Payer: 59

## 2016-11-30 ENCOUNTER — Ambulatory Visit
Admission: RE | Admit: 2016-11-30 | Discharge: 2016-11-30 | Disposition: A | Discharge status: Home / Self Care | Payer: 59 | Source: Ambulatory Visit | Attending: Obstetrics | Admitting: Obstetrics

## 2016-11-30 DIAGNOSIS — Z1231 Encounter for screening mammogram for malignant neoplasm of breast: Secondary | ICD-10-CM

## 2016-12-16 DIAGNOSIS — I889 Nonspecific lymphadenitis, unspecified: Secondary | ICD-10-CM | POA: Diagnosis not present

## 2016-12-16 DIAGNOSIS — J029 Acute pharyngitis, unspecified: Secondary | ICD-10-CM | POA: Diagnosis not present

## 2017-05-24 DIAGNOSIS — S39012A Strain of muscle, fascia and tendon of lower back, initial encounter: Secondary | ICD-10-CM | POA: Diagnosis not present

## 2017-05-24 DIAGNOSIS — M62838 Other muscle spasm: Secondary | ICD-10-CM | POA: Diagnosis not present

## 2017-06-10 DIAGNOSIS — Z23 Encounter for immunization: Secondary | ICD-10-CM | POA: Diagnosis not present

## 2017-06-10 DIAGNOSIS — I10 Essential (primary) hypertension: Secondary | ICD-10-CM | POA: Diagnosis not present

## 2017-07-21 DIAGNOSIS — R319 Hematuria, unspecified: Secondary | ICD-10-CM | POA: Diagnosis not present

## 2017-07-28 ENCOUNTER — Telehealth: Payer: Self-pay | Admitting: Obstetrics

## 2017-08-17 ENCOUNTER — Ambulatory Visit (INDEPENDENT_AMBULATORY_CARE_PROVIDER_SITE_OTHER): Payer: 59 | Admitting: Obstetrics

## 2017-08-17 ENCOUNTER — Other Ambulatory Visit: Payer: Self-pay

## 2017-08-17 ENCOUNTER — Encounter: Payer: Self-pay | Admitting: Obstetrics

## 2017-08-17 VITALS — BP 125/75 | HR 62 | Wt 230.4 lb

## 2017-08-17 DIAGNOSIS — Z01419 Encounter for gynecological examination (general) (routine) without abnormal findings: Secondary | ICD-10-CM

## 2017-08-17 DIAGNOSIS — N951 Menopausal and female climacteric states: Secondary | ICD-10-CM

## 2017-08-17 NOTE — Progress Notes (Signed)
Subjective:        Nicole [name] is a 52 y.o. female here for a routine exam.  Current complaints: None.    Personal health questionnaire:  Is patient Ashkenazi Jewish, have a family history of breast and/or ovarian cancer: no Is there a family history of uterine cancer diagnosed at age < 11, gastrointestinal cancer, urinary tract cancer, family member who is a Personnel officer syndrome-associated carrier: no Is the patient overweight and hypertensive, family history of diabetes, personal history of gestational diabetes, preeclampsia or PCOS: no Is patient over 39, have PCOS,  family history of premature CHD under age 53, diabetes, smoke, have hypertension or peripheral artery disease:  no At any time, has a partner hit, kicked or otherwise hurt or frightened you?: no Over the past 2 weeks, have you felt down, depressed or hopeless?: no Over the past 2 weeks, have you felt little interest or pleasure in doing things?:no   Gynecologic History Patient's last menstrual period was 08/05/2017 (exact date). Contraception: tubal ligation Last Pap: 2017. Results were: normal Last mammogram: 2018. Results were: normal  Obstetric History OB History  Gravida Para Term Preterm AB Living  2 2 2     2   SAB TAB Ectopic Multiple Live Births          2    # Outcome Date GA Lbr Len/2nd Weight Sex Delivery Anes PTL Lv  2 Term      Vag-Spont   LIV  1 Term      Vag-Spont   LIV      Past Medical History:  Diagnosis Date  . Medical history non-contributory     Past Surgical History:  Procedure Laterality Date  . COLONOSCOPY WITH PROPOFOL N/A 10/08/2014   Procedure: COLONOSCOPY WITH PROPOFOL;  Surgeon: Charolett Bumpers, MD;  Location: [facility];  Service: Endoscopy;  Laterality: N/A;  . TUBAL LIGATION       Current Outpatient Medications:  .  amLODipine (NORVASC) 5 MG tablet, TK 1 T PO QD, Disp: , Rfl: 1 .  Multiple Vitamin (MULTIVITAMIN WITH MINERALS) TABS tablet, Take 1 tablet by mouth every  morning. Reported on 12/17/2015, Disp: , Rfl:  .  terconazole (TERAZOL 7) 0.4 % vaginal cream, Place 1 applicator vaginally at bedtime. (Patient not taking: Reported on 08/17/2017), Disp: 45 g, Rfl: 0 No Known Allergies  Social History   Tobacco Use  . Smoking status: Never Smoker  . Smokeless tobacco: Never Used  Substance Use Topics  . Alcohol use: Yes    Alcohol/week: 1.2 - 1.8 oz    Types: 2 - 3 Glasses of wine per week    Family History  Problem Relation Age of Onset  . Alzheimer's disease Mother   . Hypertension Father   . Hyperlipidemia Father   . Diabetes Father   . Breast cancer Neg Hx       Review of Systems  Constitutional: negative for fatigue and weight loss Respiratory: negative for cough and wheezing Cardiovascular: negative for chest pain, fatigue and palpitations Gastrointestinal: negative for abdominal pain and change in bowel habits Musculoskeletal:negative for myalgias Neurological: negative for gait problems and tremors Behavioral/Psych: negative for abusive relationship, depression Endocrine: negative for temperature intolerance    Genitourinary:negative for abnormal menstrual periods, genital lesions, hot flashes, sexual problems and vaginal discharge Integument/breast: negative for breast lump, breast tenderness, nipple discharge and skin lesion(s)    Objective:       BP 125/75   Pulse 62   Wt  230 lb 6.4 oz (104.5 kg)   LMP 08/05/2017 (Exact Date)   BMI 34.52 kg/m  General:   alert  Skin:   no rash or abnormalities  Lungs:   clear to auscultation bilaterally  Heart:   regular rate and rhythm, S1, S2 normal, no murmur, click, rub or gallop  Breasts:   normal without suspicious masses, skin or nipple changes or axillary nodes  Abdomen:  normal findings: no organomegaly, soft, non-tender and no hernia  Pelvis:  External genitalia: normal general appearance Urinary system: urethral meatus normal and bladder without fullness, nontender Vaginal:  normal without tenderness, induration or masses Cervix: normal appearance Adnexa: normal bimanual exam Uterus: anteverted and non-tender, normal size   Lab Review Urine pregnancy test Labs reviewed yes Radiologic studies reviewed yes  50% of 20 min visit spent on counseling and coordination of care.   Assessment:   1. Encounter for routine gynecological examination with Papanicolaou smear of cervix Rx: - Cytology - PAP - Cervicovaginal ancillary only  2. Perimenopause - doing well.  Hot flashes only at night.   Plan:    Education reviewed: calcium supplements, depression evaluation, low fat, low cholesterol diet, safe sex/STD prevention, self breast exams and weight bearing exercise. Follow up in: 1 year.   No orders of the defined types were placed in this encounter.  No orders of the defined types were placed in this encounter.   Brock BadHARLES A. Kemya Shed MD

## 2017-08-17 NOTE — Progress Notes (Signed)
Presents

## 2017-08-18 LAB — CERVICOVAGINAL ANCILLARY ONLY
Bacterial vaginitis: NEGATIVE
Candida vaginitis: NEGATIVE
Chlamydia: NEGATIVE
Neisseria Gonorrhea: NEGATIVE
Trichomonas: NEGATIVE

## 2017-08-19 LAB — CYTOLOGY - PAP
DIAGNOSIS: NEGATIVE
HPV: NOT DETECTED

## 2017-10-19 ENCOUNTER — Other Ambulatory Visit: Payer: Self-pay | Admitting: Obstetrics

## 2017-10-19 DIAGNOSIS — Z1231 Encounter for screening mammogram for malignant neoplasm of breast: Secondary | ICD-10-CM

## 2017-12-01 ENCOUNTER — Ambulatory Visit
Admission: RE | Admit: 2017-12-01 | Discharge: 2017-12-01 | Disposition: A | Discharge status: Home / Self Care | Payer: 59 | Source: Ambulatory Visit | Attending: Obstetrics | Admitting: Obstetrics

## 2017-12-01 DIAGNOSIS — Z1231 Encounter for screening mammogram for malignant neoplasm of breast: Secondary | ICD-10-CM | POA: Diagnosis not present

## 2018-06-13 DIAGNOSIS — Z0000 Encounter for general adult medical examination without abnormal findings: Secondary | ICD-10-CM | POA: Diagnosis not present

## 2018-06-13 DIAGNOSIS — I10 Essential (primary) hypertension: Secondary | ICD-10-CM | POA: Diagnosis not present

## 2018-08-17 NOTE — Telephone Encounter (Signed)
Error

## 2018-11-08 ENCOUNTER — Other Ambulatory Visit: Payer: Self-pay | Admitting: Obstetrics

## 2018-11-08 DIAGNOSIS — Z1231 Encounter for screening mammogram for malignant neoplasm of breast: Secondary | ICD-10-CM

## 2018-12-12 ENCOUNTER — Ambulatory Visit: Payer: 59 | Admitting: Obstetrics

## 2019-01-02 ENCOUNTER — Ambulatory Visit
Admission: RE | Admit: 2019-01-02 | Discharge: 2019-01-02 | Disposition: A | Discharge status: Home / Self Care | Payer: 59 | Source: Ambulatory Visit | Attending: Obstetrics | Admitting: Obstetrics

## 2019-01-02 ENCOUNTER — Other Ambulatory Visit: Payer: Self-pay

## 2019-01-02 DIAGNOSIS — Z1231 Encounter for screening mammogram for malignant neoplasm of breast: Secondary | ICD-10-CM

## 2019-01-09 ENCOUNTER — Encounter: Payer: Self-pay | Admitting: Obstetrics

## 2019-01-09 ENCOUNTER — Ambulatory Visit (INDEPENDENT_AMBULATORY_CARE_PROVIDER_SITE_OTHER): Payer: 59 | Admitting: Obstetrics

## 2019-01-09 ENCOUNTER — Other Ambulatory Visit: Payer: Self-pay

## 2019-01-09 VITALS — BP 129/70 | HR 64 | Temp 99.4°F | Ht 68.5 in | Wt 222.9 lb

## 2019-01-09 DIAGNOSIS — Z1151 Encounter for screening for human papillomavirus (HPV): Secondary | ICD-10-CM

## 2019-01-09 DIAGNOSIS — N898 Other specified noninflammatory disorders of vagina: Secondary | ICD-10-CM

## 2019-01-09 DIAGNOSIS — Z01419 Encounter for gynecological examination (general) (routine) without abnormal findings: Secondary | ICD-10-CM

## 2019-01-09 DIAGNOSIS — E669 Obesity, unspecified: Secondary | ICD-10-CM

## 2019-01-09 DIAGNOSIS — I10 Essential (primary) hypertension: Secondary | ICD-10-CM

## 2019-01-09 DIAGNOSIS — Z124 Encounter for screening for malignant neoplasm of cervix: Secondary | ICD-10-CM | POA: Diagnosis not present

## 2019-01-09 DIAGNOSIS — E66811 Obesity, class 1: Secondary | ICD-10-CM

## 2019-01-09 DIAGNOSIS — Z113 Encounter for screening for infections with a predominantly sexual mode of transmission: Secondary | ICD-10-CM | POA: Diagnosis not present

## 2019-01-09 NOTE — Progress Notes (Signed)
Pt presents for annual and pap. STD testing offered; pt declined.  Declines abnormal vaginal discharge

## 2019-01-09 NOTE — Progress Notes (Signed)
Subjective:        Nicole [name] is a 53 y.o. female here for a routine exam.  Current complaints: None.    Personal health questionnaire:  Is patient Ashkenazi Jewish, have a family history of breast and/or ovarian cancer: no Is there a family history of uterine cancer diagnosed at age < 34, gastrointestinal cancer, urinary tract cancer, family member who is a Field seismologist syndrome-associated carrier: yes Is the patient overweight and hypertensive, family history of diabetes, personal history of gestational diabetes, preeclampsia or PCOS: yes Is patient over 59, have PCOS,  family history of premature CHD under age 80, diabetes, smoke, have hypertension or peripheral artery disease:  yes At any time, has a partner hit, kicked or otherwise hurt or frightened you?: no Over the past 2 weeks, have you felt down, depressed or hopeless?: no Over the past 2 weeks, have you felt little interest or pleasure in doing things?:no   Gynecologic History Patient's last menstrual period was 12/01/2018 (within days). Contraception: tubal ligation Last Pap: 08-17-2017. Results were: normal Last mammogram: 01-02-2019. Results were: normal  Obstetric History OB History  Gravida Para Term Preterm AB Living  2 2 2     2   SAB TAB Ectopic Multiple Live Births          2    # Outcome Date GA Lbr Len/2nd Weight Sex Delivery Anes PTL Lv  2 Term      Vag-Spont   LIV  1 Term      Vag-Spont   LIV    Past Medical History:  Diagnosis Date  . Medical history non-contributory     Past Surgical History:  Procedure Laterality Date  . COLONOSCOPY WITH PROPOFOL N/A 10/08/2014   Procedure: COLONOSCOPY WITH PROPOFOL;  Surgeon: Garlan Fair, MD;  Location: [facility];  Service: Endoscopy;  Laterality: N/A;  . TUBAL LIGATION       Current Outpatient Medications:  .  amLODipine (NORVASC) 5 MG tablet, TK 1 T PO QD, Disp: , Rfl: 1 .  Multiple Vitamin (MULTIVITAMIN WITH MINERALS) TABS tablet, Take 1 tablet by  mouth every morning. Reported on 12/17/2015, Disp: , Rfl:  .  terconazole (TERAZOL 7) 0.4 % vaginal cream, Place 1 applicator vaginally at bedtime. (Patient not taking: Reported on 08/17/2017), Disp: 45 g, Rfl: 0 No Known Allergies  Social History   Tobacco Use  . Smoking status: Never Smoker  . Smokeless tobacco: Never Used  Substance Use Topics  . Alcohol use: Yes    Alcohol/week: 2.0 - 3.0 standard drinks    Types: 2 - 3 Glasses of wine per week    Family History  Problem Relation Age of Onset  . Alzheimer's disease Mother   . Hypertension Father   . Hyperlipidemia Father   . Diabetes Father   . Breast cancer Neg Hx       Review of Systems  Constitutional: negative for fatigue and weight loss Respiratory: negative for cough and wheezing Cardiovascular: negative for chest pain, fatigue and palpitations Gastrointestinal: negative for abdominal pain and change in bowel habits Musculoskeletal:negative for myalgias Neurological: negative for gait problems and tremors Behavioral/Psych: negative for abusive relationship, depression Endocrine: negative for temperature intolerance    Genitourinary:negative for abnormal menstrual periods, genital lesions, hot flashes, sexual problems and vaginal discharge Integument/breast: negative for breast lump, breast tenderness, nipple discharge and skin lesion(s)    Objective:       BP 129/70   Pulse 64   Temp 99.4  F (37.4 C)   Ht 5' 8.5" (1.74 m)   Wt 222 lb 14.4 oz (101.1 kg)   LMP 12/01/2018 (Within Days)   BMI 33.40 kg/m  General:   alert  Skin:   no rash or abnormalities  Lungs:   clear to auscultation bilaterally  Heart:   regular rate and rhythm, S1, S2 normal, no murmur, click, rub or gallop  Breasts:   normal without suspicious masses, skin or nipple changes or axillary nodes  Abdomen:  normal findings: no organomegaly, soft, non-tender and no hernia  Pelvis:  External genitalia: normal general appearance Urinary  system: urethral meatus normal and bladder without fullness, nontender Vaginal: normal without tenderness, induration or masses Cervix: normal appearance Adnexa: normal bimanual exam Uterus: anteverted and non-tender, normal size   Lab Review Urine pregnancy test Labs reviewed yes Radiologic studies reviewed yes  50% of 25 min visit spent on counseling and coordination of care.   Assessment:     1. Encounter for gynecological examination with Papanicolaou smear of cervix - doing well  2. Vaginal discharge Rx: - Cytology - PAP - Cervicovaginal ancillary only( [place])  3. Obesity (BMI 30.0-34.9) - program of caloric reduction, exercise and behavioral modification recommended  4. HTN (hypertension), benign - managed by PCP.  BP is stable.   Plan:    Education reviewed: calcium supplements, depression evaluation, low fat, low cholesterol diet, safe sex/STD prevention, self breast exams and weight bearing exercise. Follow up in: 1 year.   No orders of the defined types were placed in this encounter.  No orders of the defined types were placed in this encounter.   Brock BadHARLES A. HARPER MD 01-09-2019

## 2019-01-10 LAB — CERVICOVAGINAL ANCILLARY ONLY
Bacterial vaginitis: NEGATIVE
Candida vaginitis: NEGATIVE
Trichomonas: NEGATIVE

## 2019-01-11 LAB — CYTOLOGY - PAP
Diagnosis: NEGATIVE
HPV: NOT DETECTED

## 2019-02-27 ENCOUNTER — Encounter

## 2020-01-09 ENCOUNTER — Other Ambulatory Visit: Payer: Self-pay | Admitting: Obstetrics

## 2020-01-09 DIAGNOSIS — Z1231 Encounter for screening mammogram for malignant neoplasm of breast: Secondary | ICD-10-CM

## 2020-01-15 ENCOUNTER — Ambulatory Visit
Admission: RE | Admit: 2020-01-15 | Discharge: 2020-01-15 | Disposition: A | Discharge status: Home / Self Care | Payer: 59 | Source: Ambulatory Visit

## 2020-01-15 ENCOUNTER — Other Ambulatory Visit: Payer: Self-pay

## 2020-01-15 DIAGNOSIS — Z1231 Encounter for screening mammogram for malignant neoplasm of breast: Secondary | ICD-10-CM

## 2020-12-02 ENCOUNTER — Other Ambulatory Visit: Payer: Self-pay | Admitting: Internal Medicine

## 2020-12-02 DIAGNOSIS — Z1231 Encounter for screening mammogram for malignant neoplasm of breast: Secondary | ICD-10-CM

## 2021-01-27 ENCOUNTER — Ambulatory Visit
Admission: RE | Admit: 2021-01-27 | Discharge: 2021-01-27 | Disposition: A | Discharge status: Home / Self Care | Payer: 59 | Source: Ambulatory Visit

## 2021-01-27 ENCOUNTER — Other Ambulatory Visit: Payer: Self-pay

## 2021-01-27 DIAGNOSIS — Z1231 Encounter for screening mammogram for malignant neoplasm of breast: Secondary | ICD-10-CM

## 2021-12-16 ENCOUNTER — Other Ambulatory Visit: Payer: Self-pay | Admitting: Internal Medicine

## 2021-12-16 DIAGNOSIS — Z1231 Encounter for screening mammogram for malignant neoplasm of breast: Secondary | ICD-10-CM

## 2022-01-16 IMAGING — MG MM DIGITAL SCREENING BILAT W/ TOMO AND CAD
8 series · 8 of 24 positions shown · non-contrast
Comparison: Previous exam(s).

CLINICAL DATA: Screening.

EXAM:
DIGITAL SCREENING BILATERAL MAMMOGRAM WITH TOMOSYNTHESIS AND CAD
TECHNIQUE: Bilateral screening digital craniocaudal and mediolateral oblique
mammograms were obtained. Bilateral screening digital breast
tomosynthesis was performed. The images were evaluated with
computer-aided detection.

[R MLO synth-2D]
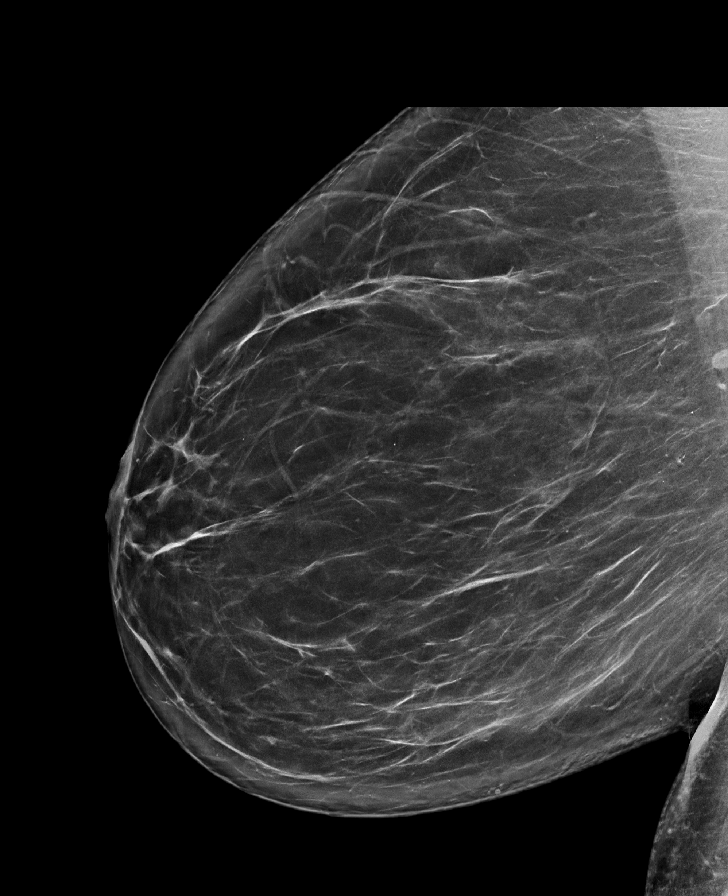

[L CC synth-2D]
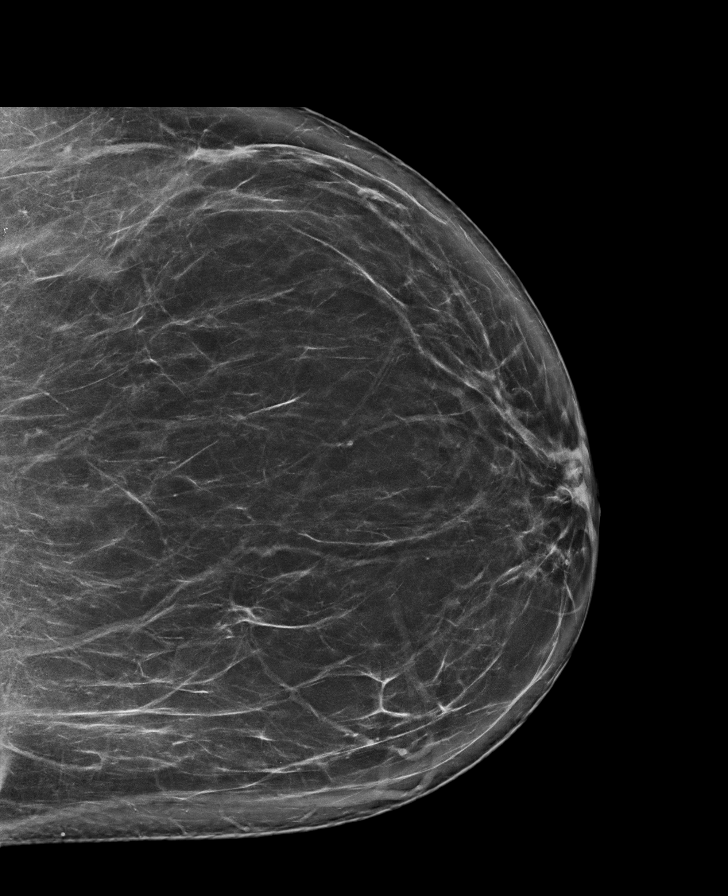

[R CC synth-2D]
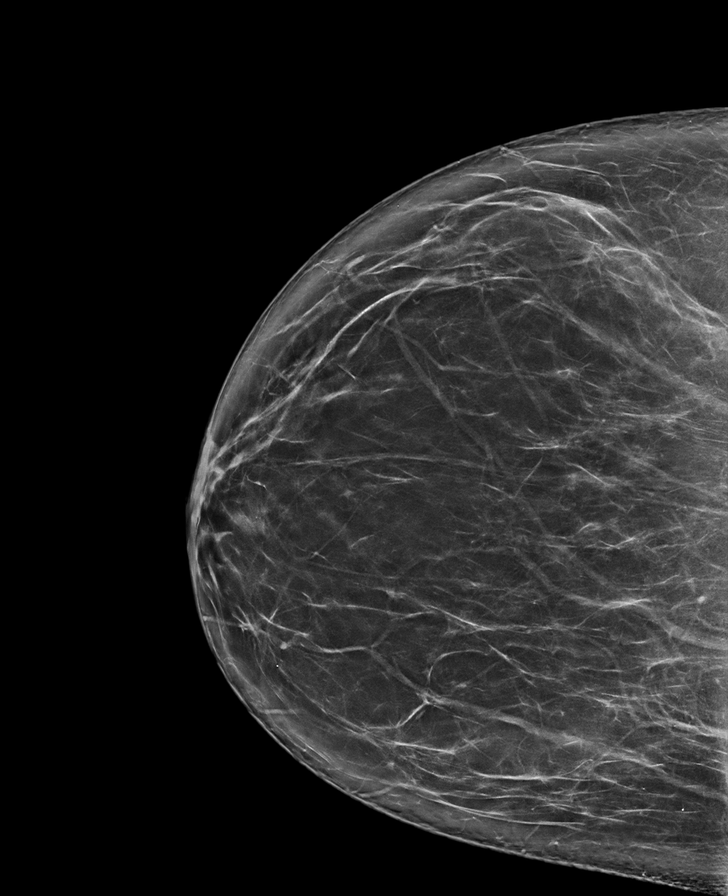

[L MLO synth-2D]
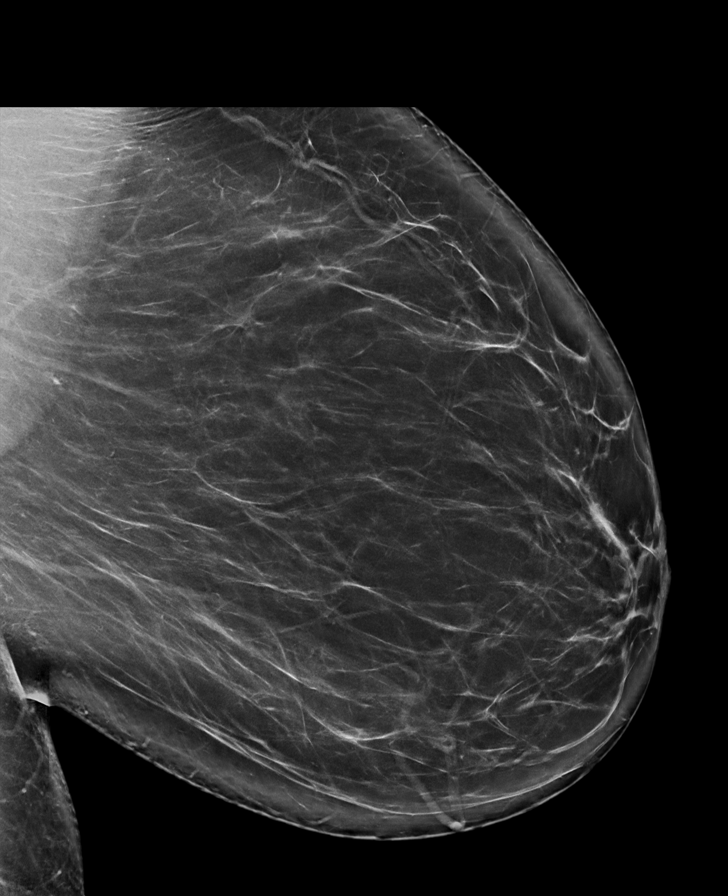

[L MLO tomo · tomo slice 53/104.0]
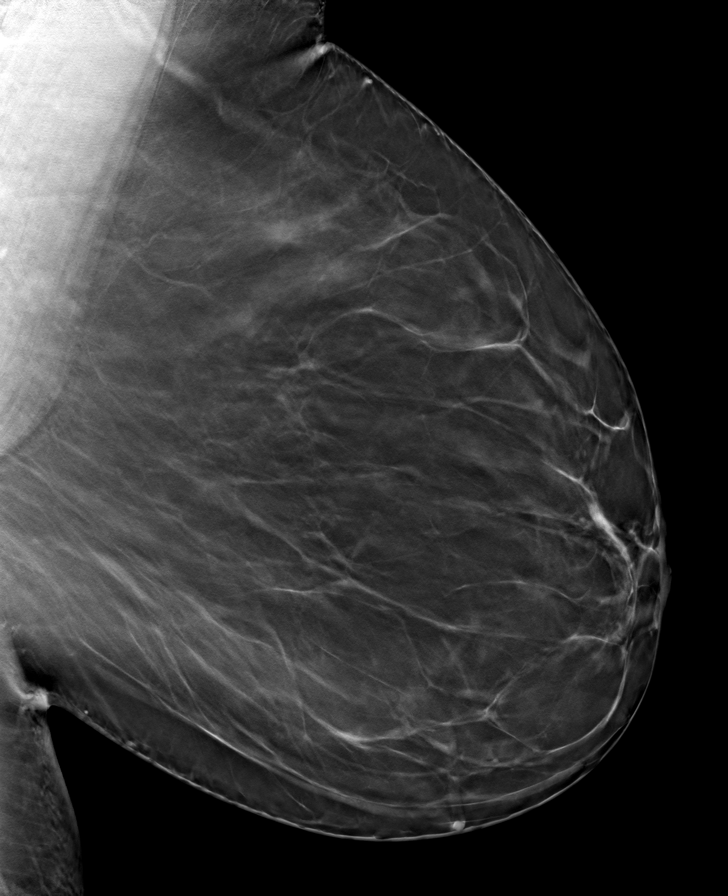

[R CC tomo · tomo slice 45/90.0]
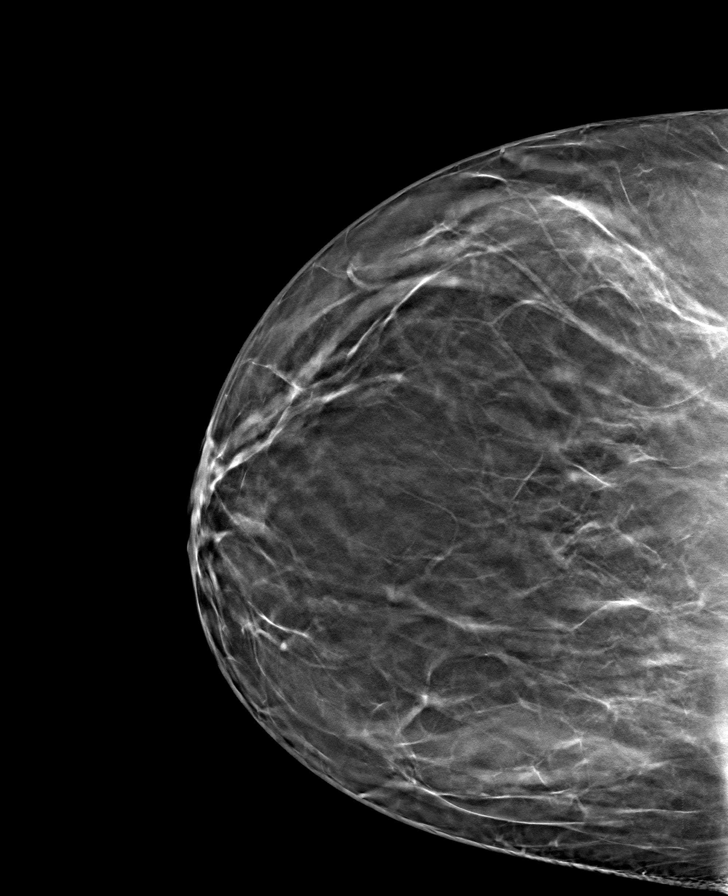

[R MLO tomo · tomo slice 48/95.0]
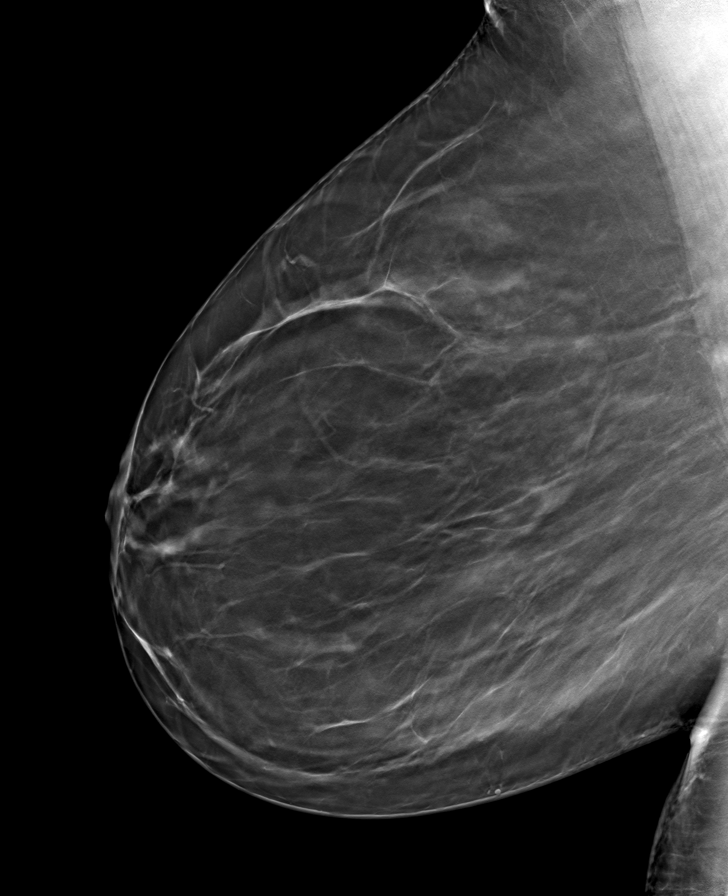

[L CC tomo · tomo slice 48/95.0]
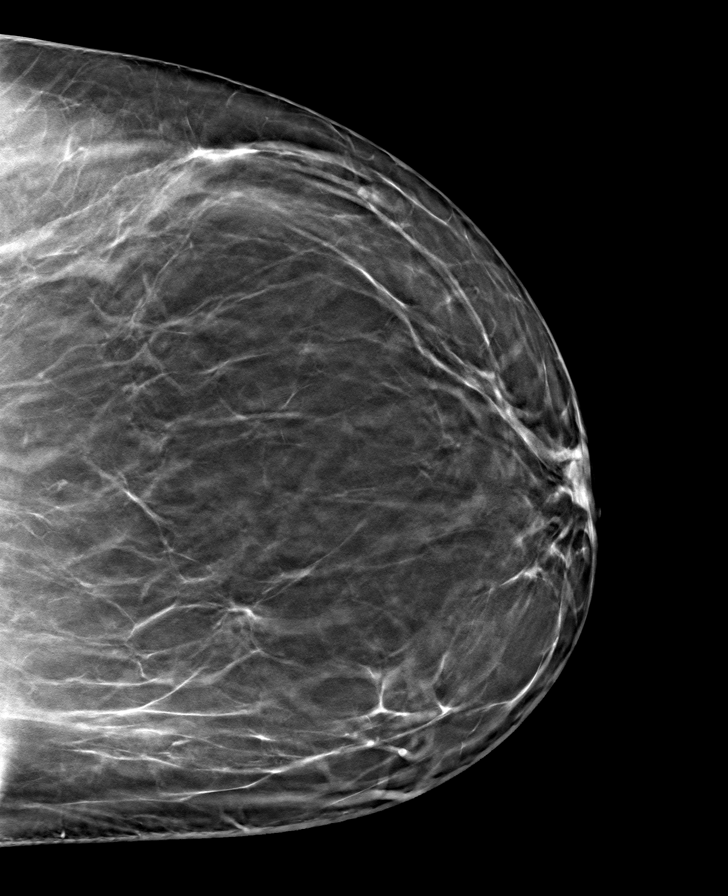

[8 of 24 positions shown; findings below may reference images not displayed]

ACR Breast Density Category b: There are scattered areas of
fibroglandular density.
FINDINGS: There are no findings suspicious for malignancy.
IMPRESSION: No mammographic evidence of malignancy. A result letter of this
screening mammogram will be mailed directly to the patient.

RECOMMENDATION:
Screening mammogram in one year. (Code:51-O-LD2)

BI-RADS CATEGORY  1: Negative.

## 2022-01-28 ENCOUNTER — Ambulatory Visit
Admission: RE | Admit: 2022-01-28 | Discharge: 2022-01-28 | Disposition: A | Discharge status: Home / Self Care | Payer: 59 | Source: Ambulatory Visit | Attending: Internal Medicine | Admitting: Internal Medicine

## 2022-01-28 ENCOUNTER — Encounter: Payer: Self-pay | Admitting: Radiology

## 2022-01-28 DIAGNOSIS — Z1231 Encounter for screening mammogram for malignant neoplasm of breast: Secondary | ICD-10-CM

## 2023-01-08 ENCOUNTER — Other Ambulatory Visit: Payer: Self-pay | Admitting: Obstetrics

## 2023-01-08 DIAGNOSIS — Z1231 Encounter for screening mammogram for malignant neoplasm of breast: Secondary | ICD-10-CM

## 2023-02-01 ENCOUNTER — Ambulatory Visit: Admission: RE | Admit: 2023-02-01 | Payer: 59 | Source: Ambulatory Visit

## 2023-02-01 DIAGNOSIS — Z1231 Encounter for screening mammogram for malignant neoplasm of breast: Secondary | ICD-10-CM

## 2023-07-18 ENCOUNTER — Ambulatory Visit
Admission: EM | Admit: 2023-07-18 | Discharge: 2023-07-18 | Disposition: A | Discharge status: Home / Self Care | Payer: 59 | Attending: Family Medicine | Admitting: Family Medicine

## 2023-07-18 DIAGNOSIS — S76911A Strain of unspecified muscles, fascia and tendons at thigh level, right thigh, initial encounter: Secondary | ICD-10-CM

## 2023-07-18 MED ORDER — MELOXICAM 15 MG PO TABS: 15.0000 mg | ORAL_TABLET | Freq: Every day | ORAL | 0 refills | Status: AC

## 2023-07-18 MED ORDER — CYCLOBENZAPRINE HCL 5 MG PO TABS: 5.0000 mg | ORAL_TABLET | Freq: Every evening | ORAL | 0 refills | Status: AC | PRN

## 2023-07-18 NOTE — ED Triage Notes (Signed)
Pt reports right leg pain x 2 days. Denies any trauma to her right leg. Tylenol and muscle relaxer gives no relief.

## 2023-07-18 NOTE — ED Provider Notes (Signed)
[facility]  Note:  This document was prepared using Conservation officer, historic buildings and may include unintentional dictation errors.  MRN: [id number] DOB: 07-01-1965  Subjective:   Nicole HUSCHER is a 58 y.o. female presenting for 2 day history of acute onset persistent right upper/medial thigh pain. No trauma, fall. No heavy lifting, rash. Did a boot camp the day before. Has used naproxen with minimal relief.   No current facility-administered medications for this encounter.  Current Outpatient Medications:    acetaminophen (TYLENOL) 500 MG tablet, Take 500 mg by mouth every 6 (six) hours as needed., Disp: , Rfl:    atorvastatin (LIPITOR) 20 MG tablet, Take 20 mg by mouth daily., Disp: , Rfl:    amLODipine (NORVASC) 5 MG tablet, TK 1 T PO QD, Disp: , Rfl: 1   Multiple Vitamin (MULTIVITAMIN WITH MINERALS) TABS tablet, Take 1 tablet by mouth every morning. Reported on 12/17/2015, Disp: , Rfl:    terconazole (TERAZOL 7) 0.4 % vaginal cream, Place 1 applicator vaginally at bedtime. (Patient not taking: Reported on 08/17/2017), Disp: 45 g, Rfl: 0   No Known Allergies  Past Medical History:  Diagnosis Date   Medical history non-contributory      Past Surgical History:  Procedure Laterality Date   COLONOSCOPY WITH PROPOFOL N/A 10/08/2014   Procedure: COLONOSCOPY WITH PROPOFOL;  Surgeon: Charolett Bumpers, MD;  Location: [facility];  Service: Endoscopy;  Laterality: N/A;   TUBAL LIGATION      Family History  Problem Relation Age of Onset   Alzheimer's disease Mother    Hypertension Father    Hyperlipidemia Father    Diabetes Father    Breast cancer Neg Hx     Social History   Tobacco Use   Smoking status: Never   Smokeless tobacco: Never  Vaping Use   Vaping status: Never Used  Substance Use Topics   Alcohol use: Yes    Alcohol/week: 2.0 - 3.0 standard drinks of alcohol    Types: 2 - 3 Glasses of wine per week   Drug use: No     ROS   Objective:   Vitals: BP 131/68 (BP Location: Right Arm)   Pulse 63   Temp 98.1 F (36.7 C) (Oral)   Resp 16   LMP 05/18/2019   SpO2 96%   Physical Exam Constitutional:      General: She is not in acute distress.    Appearance: Normal appearance. She is well-developed. She is not ill-appearing, toxic-appearing or diaphoretic.  HENT:     Head: Normocephalic and atraumatic.     Right Ear: External ear normal.     Left Ear: External ear normal.     Nose: Nose normal.     Mouth/Throat:     Mouth: Mucous membranes are moist.  Eyes:     General: No scleral icterus.       Right eye: No discharge.        Left eye: No discharge.     Extraocular Movements: Extraocular movements intact.  Cardiovascular:     Rate and Rhythm: Normal rate.  Pulmonary:     Effort: Pulmonary effort is normal.  Skin:    General: Skin is warm and dry.  Neurological:     General: No focal deficit present.     Mental Status: She is alert and oriented to person, place, and time.  Psychiatric:        Mood and Affect: Mood normal.  Behavior: Behavior normal.     Assessment and Plan :   PDMP not reviewed this encounter.  No diagnosis found.

## 2023-11-15 ENCOUNTER — Other Ambulatory Visit: Payer: Self-pay

## 2023-11-15 MED ORDER — POLYMYXIN B-TRIMETHOPRIM 10000-0.1 UNIT/ML-% OP SOLN
1.0000 [drp] | Freq: Four times a day (QID) | OPHTHALMIC | 1 refills | Status: AC
  Filled 2023-11-15: qty 10, 30d supply, fill #0

## 2023-12-21 ENCOUNTER — Other Ambulatory Visit: Payer: Self-pay | Admitting: Internal Medicine

## 2023-12-21 DIAGNOSIS — Z1231 Encounter for screening mammogram for malignant neoplasm of breast: Secondary | ICD-10-CM

## 2024-02-02 ENCOUNTER — Ambulatory Visit
Admission: RE | Admit: 2024-02-02 | Discharge: 2024-02-02 | Disposition: A | Discharge status: Home / Self Care | Source: Ambulatory Visit | Attending: Internal Medicine | Admitting: Internal Medicine

## 2024-02-02 DIAGNOSIS — Z1231 Encounter for screening mammogram for malignant neoplasm of breast: Secondary | ICD-10-CM
